# Patient Record
Sex: Female | Born: 2000 | Race: Black or African American | Hispanic: No | Marital: Single | Smoking: Never smoker
Health system: Southern US, Community
[De-identification: ages and names within clinical notes are randomized; demographics above are authoritative.]

## PROBLEM LIST (undated history)

## (undated) DIAGNOSIS — D563 Thalassemia minor: Secondary | ICD-10-CM

## (undated) HISTORY — DX: Thalassemia minor: D56.3

---
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
## 2019-02-21 ENCOUNTER — Telehealth: Payer: Self-pay | Admitting: *Deleted

## 2019-02-21 NOTE — Telephone Encounter (Signed)
Pharmacy made aware Lo Lo PA approved by insurance.

## 2019-07-25 ENCOUNTER — Telehealth: Payer: Self-pay | Admitting: Adult Health

## 2019-07-25 MED ORDER — LO LOESTRIN FE 1 MG-10 MCG / 10 MCG PO TABS: 1.0000 | ORAL_TABLET | Freq: Every day | ORAL | 4 refills | Status: DC

## 2019-07-25 NOTE — Addendum Note (Signed)
Addended by: Derrek Monaco A on: 07/25/2019 03:54 PM   Modules accepted: Orders

## 2019-07-25 NOTE — Telephone Encounter (Signed)
Pt is wanting to know if she needs an appt before her birth control can be refilled.

## 2019-07-25 NOTE — Telephone Encounter (Signed)
Refilled lo loestrin, for 1 year, she is happy it

## 2019-12-25 ENCOUNTER — Ambulatory Visit: Payer: Self-pay | Attending: Internal Medicine

## 2019-12-25 ENCOUNTER — Other Ambulatory Visit: Payer: Self-pay

## 2019-12-25 DIAGNOSIS — Z20822 Contact with and (suspected) exposure to covid-19: Secondary | ICD-10-CM | POA: Insufficient documentation

## 2019-12-26 LAB — NOVEL CORONAVIRUS, NAA: SARS-CoV-2, NAA: NOT DETECTED

## 2020-02-05 ENCOUNTER — Ambulatory Visit: Payer: 59 | Attending: Internal Medicine

## 2020-02-05 ENCOUNTER — Other Ambulatory Visit: Payer: Self-pay

## 2020-02-05 DIAGNOSIS — Z20822 Contact with and (suspected) exposure to covid-19: Secondary | ICD-10-CM | POA: Insufficient documentation

## 2020-02-07 LAB — NOVEL CORONAVIRUS, NAA: SARS-CoV-2, NAA: NOT DETECTED

## 2023-04-20 ENCOUNTER — Ambulatory Visit: Payer: 59 | Admitting: Nurse Practitioner

## 2023-04-20 ENCOUNTER — Encounter: Payer: Self-pay | Admitting: Nurse Practitioner

## 2023-04-20 VITALS — BP 114/66 | Ht 64.25 in | Wt 180.0 lb

## 2023-04-20 DIAGNOSIS — Z3041 Encounter for surveillance of contraceptive pills: Secondary | ICD-10-CM

## 2023-04-20 DIAGNOSIS — Z01419 Encounter for gynecological examination (general) (routine) without abnormal findings: Secondary | ICD-10-CM

## 2023-04-20 DIAGNOSIS — Z113 Encounter for screening for infections with a predominantly sexual mode of transmission: Secondary | ICD-10-CM | POA: Diagnosis not present

## 2023-04-20 DIAGNOSIS — Z8349 Family history of other endocrine, nutritional and metabolic diseases: Secondary | ICD-10-CM

## 2023-04-20 LAB — CBC WITH DIFFERENTIAL/PLATELET
Basophils Absolute: 62 cells/uL (ref 0–200)
Basophils Relative: 0.8 %
Eosinophils Absolute: 223 cells/uL (ref 15–500)
HCT: 38.2 % (ref 35.0–45.0)
Hemoglobin: 12 g/dL (ref 11.7–15.5)
MCHC: 31.4 g/dL — ABNORMAL LOW (ref 32.0–36.0)
MPV: 13.1 fL — ABNORMAL HIGH (ref 7.5–12.5)
Monocytes Relative: 5.7 %
Neutro Abs: 4274 cells/uL (ref 1500–7800)
Platelets: 81 10*3/uL — ABNORMAL LOW (ref 140–400)
RDW: 13.1 % (ref 11.0–15.0)
WBC: 7.7 10*3/uL (ref 3.8–10.8)

## 2023-04-20 MED ORDER — NORETHIN ACE-ETH ESTRAD-FE 1-20 MG-MCG PO TABS: 1.0000 | ORAL_TABLET | Freq: Every day | ORAL | 3 refills | Status: DC

## 2023-04-20 NOTE — Progress Notes (Signed)
   Christy Park 2001/05/28 [id number]   History:  22 y.o. G0 presents as new patient to establish care. Monthly cycles. COCs. Normal pap history. Gardasil series completed. Would like STD screening today. Multiple family family members on mother's side with thyroid disease.   Gynecologic History Patient's last menstrual period was 03/30/2023 (exact date). Period Cycle (Days): 28 Period Duration (Days): 5 Period Pattern: Regular Menstrual Flow: Heavy Menstrual Control: Maxi pad Dysmenorrhea: (!) Moderate Dysmenorrhea Symptoms: Cramping Contraception/Family planning: OCP (estrogen/progesterone) Sexually active: Yes  Health Maintenance Last Pap: 01/24/2021. Results were: Normal Last mammogram: Not indicated Last colonoscopy: Not indicated Last Dexa: Not indicated   Past medical history, past surgical history, family history and social history were all reviewed and documented in the EPIC chart. 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   ROS:  A ROS was performed and pertinent positives and negatives are included.  Exam:  Vitals:   04/20/23 0758  BP: 114/66  Weight: 180 lb (81.6 kg)  Height: 5' 4.25" (1.632 m)   Body mass index is 30.66 kg/m.  General appearance:  Normal Thyroid:  Symmetrical, normal in size, without palpable masses or nodularity. Respiratory  Auscultation:  Clear without wheezing or rhonchi Cardiovascular  Auscultation:  Regular rate, without rubs, murmurs or gallops  Edema/varicosities:  Not grossly evident Abdominal  Soft,nontender, without masses, guarding or rebound.  Liver/spleen:  No organomegaly noted  Hernia:  None appreciated  Skin  Inspection:  Grossly normal Breasts: Not indicated per guidelines Genitourinary   Inguinal/mons:  Normal without inguinal adenopathy  External genitalia:  Normal appearing vulva with no masses, tenderness, or lesions  BUS/Urethra/Skene's glands:  Normal  Vagina:  Normal appearing with normal  color and discharge, no lesions  Cervix:  Normal appearing without discharge or lesions  Uterus:  Normal in size, shape and contour.  Midline and mobile, nontender  Adnexa/parametria:     Rt: Normal in size, without masses or tenderness.   Lt: Normal in size, without masses or tenderness.  Anus and perineum: Normal  Digital rectal exam: Deferred  Patient informed chaperone available to be present for breast and pelvic exam. Patient has requested no chaperone to be present. Patient has been advised what will be completed during breast and pelvic exam.   Assessment/Plan:  22 y.o. G0 to establish care.   Well female exam with routine gynecological exam - Plan: CBC with Differential/Platelet, Comprehensive metabolic panel. Education provided on SBEs, importance of preventative screenings, current guidelines, high calcium diet, regular exercise, and multivitamin daily.   Screening examination for STD (sexually transmitted disease) - Plan: SURESWAB CT/NG/T. vaginalis, HIV Antibody (routine testing w rflx), RPR  Encounter for surveillance of contraceptive pills - Plan: norethindrone-ethinyl estradiol-FE (LOESTRIN FE) 1-20 MG-MCG tablet daily. Taking as prescribed. Refill x 1 year provided.   Family history of thyroid disease - Plan: TSH  Screening for cervical cancer - Normal Pap history.  Will repeat at 3-year interval per guidelines.   Return in 1 year for annual.     Olivia Mackie DNP, 8:17 AM 04/20/2023

## 2023-04-21 ENCOUNTER — Other Ambulatory Visit: Payer: Self-pay | Admitting: Nurse Practitioner

## 2023-04-21 DIAGNOSIS — D696 Thrombocytopenia, unspecified: Secondary | ICD-10-CM

## 2023-04-21 LAB — COMPREHENSIVE METABOLIC PANEL
AG Ratio: 1.3 (calc) (ref 1.0–2.5)
ALT: 9 U/L (ref 6–29)
AST: 13 U/L (ref 10–30)
Albumin: 3.9 g/dL (ref 3.6–5.1)
Alkaline phosphatase (APISO): 68 U/L (ref 31–125)
BUN/Creatinine Ratio: 9 (calc) (ref 6–22)
BUN: 6 mg/dL — ABNORMAL LOW (ref 7–25)
CO2: 23 mmol/L (ref 20–32)
Calcium: 9.2 mg/dL (ref 8.6–10.2)
Chloride: 105 mmol/L (ref 98–110)
Creat: 0.69 mg/dL (ref 0.50–0.96)
Globulin: 3 g/dL (calc) (ref 1.9–3.7)
Glucose, Bld: 91 mg/dL (ref 65–99)
Potassium: 4.2 mmol/L (ref 3.5–5.3)
Sodium: 136 mmol/L (ref 135–146)
Total Bilirubin: 0.3 mg/dL (ref 0.2–1.2)
Total Protein: 6.9 g/dL (ref 6.1–8.1)

## 2023-04-21 LAB — CBC WITH DIFFERENTIAL/PLATELET
Absolute Monocytes: 439 cells/uL (ref 200–950)
Eosinophils Relative: 2.9 %
Lymphs Abs: 2703 cells/uL (ref 850–3900)
MCH: 22.1 pg — ABNORMAL LOW (ref 27.0–33.0)
MCV: 70.5 fL — ABNORMAL LOW (ref 80.0–100.0)
Neutrophils Relative %: 55.5 %
RBC: 5.42 10*6/uL — ABNORMAL HIGH (ref 3.80–5.10)
Total Lymphocyte: 35.1 %

## 2023-04-21 LAB — RPR: RPR Ser Ql: NONREACTIVE

## 2023-04-21 LAB — SURESWAB CT/NG/T. VAGINALIS
C. trachomatis RNA, TMA: NOT DETECTED
N. gonorrhoeae RNA, TMA: NOT DETECTED
Trichomonas vaginalis RNA: NOT DETECTED

## 2023-04-21 LAB — TSH: TSH: 2.4 mIU/L

## 2023-04-21 LAB — HIV ANTIBODY (ROUTINE TESTING W REFLEX): HIV 1&2 Ab, 4th Generation: NONREACTIVE

## 2023-04-23 ENCOUNTER — Encounter: Payer: Self-pay | Admitting: Physician Assistant

## 2023-04-23 ENCOUNTER — Inpatient Hospital Stay: Payer: 59 | Admitting: Physician Assistant

## 2023-04-23 ENCOUNTER — Inpatient Hospital Stay: Payer: 59 | Attending: Physician Assistant

## 2023-04-23 VITALS — BP 121/78 | HR 67 | Temp 97.9°F | Resp 15 | Ht 64.25 in | Wt 181.1 lb

## 2023-04-23 DIAGNOSIS — D696 Thrombocytopenia, unspecified: Secondary | ICD-10-CM | POA: Insufficient documentation

## 2023-04-23 DIAGNOSIS — R718 Other abnormality of red blood cells: Secondary | ICD-10-CM | POA: Diagnosis not present

## 2023-04-23 LAB — CMP (CANCER CENTER ONLY)
ALT: 10 U/L (ref 0–44)
AST: 15 U/L (ref 15–41)
Albumin: 4.5 g/dL (ref 3.5–5.0)
Alkaline Phosphatase: 73 U/L (ref 38–126)
Anion gap: 7 (ref 5–15)
BUN: 8 mg/dL (ref 6–20)
CO2: 27 mmol/L (ref 22–32)
Calcium: 9.8 mg/dL (ref 8.9–10.3)
Chloride: 104 mmol/L (ref 98–111)
Creatinine: 0.71 mg/dL (ref 0.44–1.00)
GFR, Estimated: >60 mL/min (ref >60)
Glucose, Bld: 89 mg/dL (ref 70–99)
Potassium: 3.9 mmol/L (ref 3.5–5.1)
Sodium: 138 mmol/L (ref 135–145)
Total Bilirubin: 0.4 mg/dL (ref 0.3–1.2)
Total Protein: 8.2 g/dL — ABNORMAL HIGH (ref 6.5–8.1)

## 2023-04-23 LAB — CBC WITH DIFFERENTIAL (CANCER CENTER ONLY)
Abs Immature Granulocytes: 0.04 10*3/uL (ref 0.00–0.07)
Basophils Absolute: 0.1 10*3/uL (ref 0.0–0.1)
Basophils Relative: 1 %
Eosinophils Absolute: 0.2 10*3/uL (ref 0.0–0.5)
Eosinophils Relative: 2 %
HCT: 41.8 % (ref 36.0–46.0)
Hemoglobin: 13.3 g/dL (ref 12.0–15.0)
Immature Granulocytes: 0 %
Lymphocytes Relative: 22 %
Lymphs Abs: 2 10*3/uL (ref 0.7–4.0)
MCH: 22.4 pg — ABNORMAL LOW (ref 26.0–34.0)
MCHC: 31.8 g/dL (ref 30.0–36.0)
MCV: 70.3 fL — ABNORMAL LOW (ref 80.0–100.0)
Monocytes Absolute: 0.4 10*3/uL (ref 0.1–1.0)
Monocytes Relative: 4 %
Neutro Abs: 6.4 10*3/uL (ref 1.7–7.7)
Neutrophils Relative %: 71 %
Platelet Count: ADEQUATE 10*3/uL (ref 150–400)
RBC: 5.95 MIL/uL — ABNORMAL HIGH (ref 3.87–5.11)
RDW: 13.9 % (ref 11.5–15.5)
Smear Review: ADEQUATE
WBC Count: 9 10*3/uL (ref 4.0–10.5)
nRBC: 0 % (ref 0.0–0.2)

## 2023-04-23 LAB — TECHNOLOGIST SMEAR REVIEW: Plt Morphology: ADEQUATE

## 2023-04-23 LAB — HEPATITIS B SURFACE ANTIBODY,QUALITATIVE: Hep B S Ab: NONREACTIVE

## 2023-04-23 LAB — IRON AND IRON BINDING CAPACITY (CC-WL,HP ONLY)
Iron: 77 ug/dL (ref 28–170)
Saturation Ratios: 14 % (ref 10.4–31.8)
TIBC: 545 ug/dL — ABNORMAL HIGH (ref 250–450)
UIBC: 468 ug/dL — ABNORMAL HIGH (ref 148–442)

## 2023-04-23 LAB — IMMATURE PLATELET FRACTION: Immature Platelet Fraction: 15.6 % — ABNORMAL HIGH (ref 1.2–8.6)

## 2023-04-23 LAB — HEPATITIS B SURFACE ANTIGEN: Hepatitis B Surface Ag: NONREACTIVE

## 2023-04-23 LAB — HEPATITIS C ANTIBODY: HCV Ab: NONREACTIVE

## 2023-04-23 LAB — HEPATITIS B CORE ANTIBODY, TOTAL: Hep B Core Total Ab: NONREACTIVE

## 2023-04-23 LAB — FOLATE: Folate: 13.6 ng/mL (ref >5.9)

## 2023-04-23 LAB — FERRITIN: Ferritin: 16 ng/mL (ref 11–307)

## 2023-04-23 LAB — VITAMIN B12: Vitamin B-12: 162 pg/mL — ABNORMAL LOW (ref 180–914)

## 2023-04-23 NOTE — Progress Notes (Unsigned)
[facility] Telephone:([phone]) [phone]   Fax:(336) [phone]  INITIAL CONSULT NOTE  Patient Care Team: Patient, No Pcp Per as PCP - General (General Practice)  Hematological/Oncological History 04/20/2023: WBC 7.7, Hgb 12.0, MCV 70.5 (L), Plt 81 (L). HIV nonreactive. Creatinine and LFTs normal.  04/23/2023: Establish care with [facility] Hematology  CHIEF COMPLAINTS/PURPOSE OF CONSULTATION:  "Microcytosis and Thrombocytopenia "  HISTORY OF PRESENTING ILLNESS:  Christy Park 22 y.o. female without any medical conditions presents to the hematology clinic for evaluation of microcytosis and thrombocytopenia. She is unaccompanied for this visit.  On exam today, Christy Park reports she is overall feeling well without any concerning symptoms. Her energy is adequate and she is able to complete all her ADLs on her own. She denies any dietary restrictions and tries to eat regularly to avoid her blood sugar from dropping too low. She denies any nausea, vomiting or abdominal pain. Her bowel habits are unchanged without recurrent episodes of diarrhea or constipation. She denies easy bruising or signs of bleeding. She has regular menstrual cycles occurring every 28 days. Each cycle lasts 5-7 days with 3 days of heavy bleeding. She denies fevers, chills, sweats, shortness of breath, chest pain or cough. She has no other complaints. Rest of the 10 point ROS is below.   MEDICAL HISTORY:  History reviewed. No pertinent past medical history.  SURGICAL HISTORY: History reviewed. No pertinent surgical history.  SOCIAL HISTORY: Social History   Socioeconomic History   Marital status: Single    Spouse name: Not on file   Number of children: Not on file   Years of education: Not on file   Highest education level: Not on file  Occupational History   Not on file  Tobacco Use   Smoking status: Never    Passive exposure: Never   Smokeless tobacco: Never  Substance and Sexual Activity   Alcohol  use: Never   Drug use: Never   Sexual activity: Yes    Partners: Male    Birth control/protection: Pill    Comment: menarche 22yo, sexual debut 22yo  Other Topics Concern   Not on file  Social History Narrative   Not on file   Social Determinants of Health   Financial Resource Strain: Not on file  Food Insecurity: No Food Insecurity (04/23/2023)   Hunger Vital Sign    Worried About Running Out of Food in the Last Year: Never true    Ran Out of Food in the Last Year: Never true  Transportation Needs: No Transportation Needs (04/23/2023)   PRAPARE - Administrator, Civil Service (Medical): No    Lack of Transportation (Non-Medical): No  Physical Activity: Not on file  Stress: Not on file  Social Connections: Not on file  Intimate Partner Violence: Not on file    FAMILY HISTORY: Family History  Problem Relation Age of Onset   Diabetes Father    Prostate cancer Father    Other Maternal Grandmother        tumor    ALLERGIES:  has No Known Allergies.  MEDICATIONS:  Current Outpatient Medications  Medication Sig Dispense Refill   norethindrone-ethinyl estradiol-FE (LOESTRIN FE) 1-20 MG-MCG tablet Take 1 tablet by mouth daily. 84 tablet 3   No current facility-administered medications for this visit.    REVIEW OF SYSTEMS:   Constitutional: ( - ) fevers, ( - )  chills , ( - ) night sweats Eyes: ( - ) blurriness of vision, ( - ) double  vision, ( - ) watery eyes Ears, nose, mouth, throat, and face: ( - ) mucositis, ( - ) sore throat Respiratory: ( - ) cough, ( - ) dyspnea, ( - ) wheezes Cardiovascular: ( - ) palpitation, ( - ) chest discomfort, ( - ) lower extremity swelling Gastrointestinal:  ( - ) nausea, ( - ) heartburn, ( - ) change in bowel habits Skin: ( - ) abnormal skin rashes Lymphatics: ( - ) new lymphadenopathy, ( - ) easy bruising Neurological: ( - ) numbness, ( - ) tingling, ( - ) new weaknesses Behavioral/Psych: ( - ) mood change, ( - ) new changes   All other systems were reviewed with the patient and are negative.  PHYSICAL EXAMINATION: ECOG PERFORMANCE STATUS: 0 - Asymptomatic  Vitals:   04/23/23 1102  BP: 121/78  Pulse: 67  Resp: 15  Temp: 97.9 F (36.6 C)  SpO2: 100%   Filed Weights   04/23/23 1102  Weight: 181 lb 1.6 oz (82.1 kg)    GENERAL: well appearing female in NAD  SKIN: skin color, texture, turgor are normal, no rashes or significant lesions EYES: conjunctiva are pink and non-injected, sclera clear LYMPH:  no palpable lymphadenopathy in the cervical or supraclavicular lymph nodes.  LUNGS: clear to auscultation and percussion with normal breathing effort HEART: regular rate & rhythm and no murmurs and no lower extremity edema ABDOMEN: soft, non-tender, non-distended, normal bowel sounds Musculoskeletal: no cyanosis of digits and no clubbing  PSYCH: alert & oriented x 3, fluent speech NEURO: no focal motor/sensory deficits  LABORATORY DATA:  I have reviewed the data as listed    Latest Ref Rng & Units 04/20/2023    8:14 AM 03/25/2018    9:19 AM  CBC  WBC 3.8 - 10.8 Thousand/uL 7.7  8.2   Hemoglobin 11.7 - 15.5 g/dL 16.1  09.6   Hematocrit 35.0 - 45.0 % 38.2  36.4   Platelets 140 - 400 Thousand/uL 81  235        Latest Ref Rng & Units 04/20/2023    8:14 AM 03/25/2018    9:19 AM  CMP  Glucose 65 - 99 mg/dL 91  88   BUN 7 - 25 mg/dL 6  9   Creatinine 0.45 - 0.96 mg/dL 4.09  8.11   Sodium 914 - 146 mmol/L 136  136   Potassium 3.5 - 5.3 mmol/L 4.2  4.4   Chloride 98 - 110 mmol/L 105  102   CO2 20 - 32 mmol/L 23  19   Calcium 8.6 - 10.2 mg/dL 9.2  9.7   Total Protein 6.1 - 8.1 g/dL 6.9  7.2   Total Bilirubin 0.2 - 1.2 mg/dL 0.3  0.4   Alkaline Phos 43 - 101 IU/L  73   AST 10 - 30 U/L 13  20   ALT 6 - 29 U/L 9  13     ASSESSMENT & PLAN Christy Park is a 22 y.o. female who presents to the hematology clinic for evaluation of microcytosis and thrombocytopenia.   I reviewed potential etiologies  for thrombocytopenia including liver disease and/or splenomegaly, infectious processes, nutritional anemias, immune mediated, pseudothrombocytopenia and bone marrow disorders. Patient is taking an OTC sea moss supplemental for the last two weeks. Advised to hold supplement until our workup is complete. Patient expressed understanding of the plan provided.   Regarding her microcytosis, etiologies including iron deficiency and hemoglobinopathies. We will check iron panel, hgb electrophoresis and alpha thalassemia gene testing.    #  Thrombocytopenia, etiology unknown: --Labs from 04/20/2023 showed Hgb 12.0, MCV 70.5 (L), Plt 81 (L). HIV nonreactive. --Labs today to check CBC w/diff, CMP, B12 level, folate level, Hep B and C serologies --Evaluate for platelet abnormality with peripheral smear. --Evaluate for liver disease and/or splenomegaly with abdominal US if today's blood is unremarkable.  --RTC based on above workup.   #Microcytosis: --Labs today to check ferritin, iron and TIBC, hgb electrophoresis, alpha thalassemia gene testing  Orders Placed This Encounter  Procedures   CBC with Differential (Cancer Center Only)    Standing Status:   Future    Number of Occurrences:   1    Standing Expiration Date:   04/22/2024   CMP (Cancer Center only)    Standing Status:   Future    Number of Occurrences:   1    Standing Expiration Date:   04/22/2024   Immature Platelet Fraction    Standing Status:   Future    Number of Occurrences:   1    Standing Expiration Date:   04/22/2024   Technologist smear review    Order Specific Question:   Clinical information:    Answer:   thrombocytopenia, microcytosis   Vitamin B12    Standing Status:   Future    Number of Occurrences:   1    Standing Expiration Date:   04/22/2024   Methylmalonic acid, serum    Standing Status:   Future    Number of Occurrences:   1    Standing Expiration Date:   04/22/2024   Folate, Serum    Standing Status:   Future    Number of  Occurrences:   1    Standing Expiration Date:   04/22/2024   Ferritin    Standing Status:   Future    Number of Occurrences:   1    Standing Expiration Date:   04/22/2024   Hepatitis B core antibody, total    Standing Status:   Future    Number of Occurrences:   1    Standing Expiration Date:   04/22/2024   Hepatitis B surface antibody    Standing Status:   Future    Number of Occurrences:   1    Standing Expiration Date:   04/22/2024   Hepatitis B surface antigen    Standing Status:   Future    Number of Occurrences:   1    Standing Expiration Date:   04/22/2024   Hepatitis C antibody    Standing Status:   Future    Number of Occurrences:   1    Standing Expiration Date:   04/22/2024   Alpha-Thalassemia GenotypR    Standing Status:   Future    Number of Occurrences:   1    Standing Expiration Date:   04/22/2024   Hgb Fractionation Cascade    Standing Status:   Future    Number of Occurrences:   1    Standing Expiration Date:   04/22/2024    All questions were answered. The patient knows to call the clinic with any problems, questions or concerns.  I have spent a total of 60 minutes minutes of face-to-face and non-face-to-face time, preparing to see the patient, obtaining and/or reviewing separately obtained history, performing a medically appropriate examination, counseling and educating the patient, ordering tests/procedures,  documenting clinical information in the electronic health record, and care coordination.   Georga Kaufmann, PA-C Department of Hematology/Oncology [facility]  Phone: [phone]  Patient was seen with Dr. Candise Che.    ADDENDUM  .Patient was Personally and independently interviewed, examined and relevant elements of the history of present illness were reviewed in details and an assessment and plan was created. All elements of the patient's history of present illness , assessment and plan were discussed in details with Georga Kaufmann,  PA-C. The above documentation reflects our combined findings assessment and plan.   Wyvonnia Lora MD MS

## 2023-04-27 LAB — METHYLMALONIC ACID, SERUM: Methylmalonic Acid, Quantitative: 97 nmol/L (ref 0–378)

## 2023-04-28 LAB — HGB FRACTIONATION CASCADE
Hgb A2: 2.7 % (ref 1.8–3.2)
Hgb A: 97.3 % (ref 96.4–98.8)
Hgb F: 0 % (ref 0.0–2.0)
Hgb S: 0 %

## 2023-05-07 ENCOUNTER — Other Ambulatory Visit: Payer: Self-pay | Admitting: Physician Assistant

## 2023-05-07 NOTE — Progress Notes (Signed)
Tried to reach patient and mother. Unable to leave voicemail for patient. Left voicemail for patient's mother to return call to review lab results.

## 2023-05-10 ENCOUNTER — Telehealth: Payer: Self-pay | Admitting: Physician Assistant

## 2023-05-10 MED ORDER — VITAMIN B-12 1000 MCG PO TABS: 1000.0000 ug | ORAL_TABLET | Freq: Every day | ORAL | 3 refills | Status: AC

## 2023-05-10 NOTE — Telephone Encounter (Signed)
I spoke to [name] to review the labs from 04/23/2023. CBC showed that the platelets clumped but the count was adequate. She still has persistent microcytosis with MCV 70.3. No evidence of leukopenia or anemia. Remaining labs shows iron deficiency and vitamin B12 deficiency. Patient picked up a prescription for iron supplementation. Advised to aim for 65 mg of elemental iron per day and take with a source of vitamin C.Likely cause of iron deficiency is heavy menstrual cycles so recommend to follow up with OB/GYN.   Lastly, there is evidence of vitamin B12 deficiency so sent a prescription for vitamin B12 1000 mcg /day.   We will see patient back in clinic in 3 months with repeat labs. Only remaining lab that is pending is alpha thalassemia gene testing, I will follow up with patient once that is available.   Patient expressed understanding of the plan provided.

## 2023-05-19 LAB — ALPHA-THALASSEMIA GENOTYPR

## 2023-08-04 ENCOUNTER — Telehealth: Payer: Self-pay | Admitting: Physician Assistant

## 2023-08-04 NOTE — Telephone Encounter (Signed)
Cancelled appointments per incoming call and patient request. At this time, the patient does not want to reschedule. Patient states she will call back when she is ready to reschedule.

## 2023-08-06 ENCOUNTER — Inpatient Hospital Stay: Payer: 59 | Admitting: Physician Assistant

## 2023-08-06 ENCOUNTER — Inpatient Hospital Stay: Payer: 59

## 2023-12-30 ENCOUNTER — Other Ambulatory Visit: Payer: Self-pay

## 2023-12-30 ENCOUNTER — Encounter (HOSPITAL_COMMUNITY): Payer: Self-pay | Admitting: Emergency Medicine

## 2023-12-30 ENCOUNTER — Emergency Department (HOSPITAL_COMMUNITY)
Admission: EM | Admit: 2023-12-30 | Discharge: 2023-12-31 | Disposition: A | Discharge status: Home / Self Care | Payer: 59 | Attending: Emergency Medicine | Admitting: Emergency Medicine

## 2023-12-30 DIAGNOSIS — J0300 Acute streptococcal tonsillitis, unspecified: Secondary | ICD-10-CM | POA: Insufficient documentation

## 2023-12-30 DIAGNOSIS — Z20822 Contact with and (suspected) exposure to covid-19: Secondary | ICD-10-CM | POA: Insufficient documentation

## 2023-12-30 DIAGNOSIS — R509 Fever, unspecified: Secondary | ICD-10-CM

## 2023-12-30 DIAGNOSIS — R052 Subacute cough: Secondary | ICD-10-CM

## 2023-12-30 MED ORDER — ACETAMINOPHEN 325 MG PO TABS
650.0000 mg | ORAL_TABLET | Freq: Once | ORAL | Status: AC | PRN
  Administered 2023-12-30: 650 mg via ORAL
  Filled 2023-12-30: qty 2

## 2023-12-30 NOTE — ED Triage Notes (Signed)
 Pt in with cough and weakness progressively worse x 1 mo. Pt states she went to UC last week and was placed on prednisone and feels worse. Also reports fever that developed today

## 2023-12-31 LAB — RESP PANEL BY RT-PCR (RSV, FLU A&B, COVID)  RVPGX2
Influenza A by PCR: NEGATIVE
Influenza B by PCR: NEGATIVE
Resp Syncytial Virus by PCR: NEGATIVE
SARS Coronavirus 2 by RT PCR: NEGATIVE

## 2023-12-31 LAB — GROUP A STREP BY PCR: Group A Strep by PCR: DETECTED — AB

## 2023-12-31 MED ORDER — PENICILLIN G BENZATHINE 1200000 UNIT/2ML IM SUSY
1.2000 10*6.[IU] | PREFILLED_SYRINGE | Freq: Once | INTRAMUSCULAR | Status: AC
  Administered 2023-12-31: 1.2 10*6.[IU] via INTRAMUSCULAR
  Filled 2023-12-31: qty 2

## 2023-12-31 MED ORDER — DEXAMETHASONE 4 MG PO TABS
10.0000 mg | ORAL_TABLET | Freq: Once | ORAL | Status: AC
  Administered 2023-12-31: 10 mg via ORAL
  Filled 2023-12-31: qty 3

## 2023-12-31 NOTE — ED Provider Notes (Signed)
 [place] EMERGENCY DEPARTMENT AT [facility] Provider Note   CSN: [id number] Arrival date & time: 12/30/23  2247     History  Chief Complaint  Patient presents with   Fever   Cough    Christy Park is a 24 y.o. female.  The history is provided by the patient.  Fever Associated symptoms: cough   Cough Associated symptoms: fever   She complains of sore throat for the last 3 days, fever today as high as 101.  There have been associated chills and sweats.  She has also had a cough for about the last month which is productive of some yellowish sputum.  She had nausea and vomiting about a week ago, none since then.  She started having arthralgias and myalgias today.  She denies any sick contacts.   Home Medications Prior to Admission medications   Medication Sig Start Date End Date Taking? Authorizing Provider  cyanocobalamin  (VITAMIN B12) 1000 MCG tablet Take 1 tablet (1,000 mcg total) by mouth daily. 05/10/23   Thayil, Irene T, PA-C  norethindrone-ethinyl estradiol-FE (LOESTRIN  FE) 1-20 MG-MCG tablet Take 1 tablet by mouth daily. 04/20/23   Prentiss Annabella LABOR, NP      Allergies    Patient has no known allergies.    Review of Systems   Review of Systems  Constitutional:  Positive for fever.  Respiratory:  Positive for cough.   All other systems reviewed and are negative.   Physical Exam Updated Vital Signs BP (!) 151/84   Pulse (!) 118   Temp (!) 100.8 F (38.2 C) (Oral)   Resp 20   Wt 83.9 kg   LMP 12/01/2023   SpO2 100%   BMI 31.51 kg/m  Physical Exam Vitals and nursing note reviewed.   23 year old female, resting comfortably and in no acute distress. Vital signs are significant for elevated temperature, heart rate, blood pressure. Oxygen saturation is 100%, which is normal. Head is normocephalic and atraumatic. PERRLA, EOMI. Oropharynx shows tonsillar erythema and hypertrophy with exudate present on the right.  Voice is minimally muffled.  There is  no pooling of secretions. Neck is nontender and supple without adenopathy. Lungs are clear without rales, wheezes, or rhonchi. Chest is nontender. Heart has regular rate and rhythm without murmur. Abdomen is soft, flat, nontender. Skin is warm and dry without rash. Neurologic: Mental status is normal, cranial nerves are intact, moves all extremities equally.  ED Results / Procedures / Treatments   Labs (all labs ordered are listed, but only abnormal results are displayed) Labs Reviewed  GROUP A STREP BY PCR - Abnormal; Notable for the following components:      Result Value   Group A Strep by PCR DETECTED (*)    All other components within normal limits  RESP PANEL BY RT-PCR (RSV, FLU A&B, COVID)  [id number]   Procedures Procedures    Medications Ordered in ED Medications  penicillin  g benzathine (BICILLIN  LA) 1200000 UNIT/2ML injection 1.2 Million Units (has no administration in time range)  dexamethasone  (DECADRON ) tablet 10 mg (has no administration in time range)  acetaminophen  (TYLENOL ) tablet 650 mg (650 mg Oral Given 12/30/23 2321)    ED Course/ Medical Decision Making/ A&P                                 Medical Decision Making Risk OTC drugs. Prescription drug management.   Cough, tonsillitis.  I  suspect that these are actually 2 separate processes.  Likely viral illness, consider streptococcal infection.  I have reviewed her laboratory test, and my interpretation is negative PCR for COVID-19, Influenza, RSV.  Positive PCR for strep.  Strep is clearly what is causing her throat symptoms and tonsillitis, but I doubt it is responsible for her cough.  Through shared decision making, I offered patient the option of 10 days of oral antibiotics versus parenteral penicillin .  She has opted for parenteral penicillin .  I have ordered a dose of Bicillin  LA and oral dexamethasone .  Final Clinical Impression(s) / ED Diagnoses Final diagnoses:  Streptococcal tonsillitis  Fever in  adult  Subacute cough    Rx / DC Orders ED Discharge Orders     None         Raford Lenis, MD 12/31/23 [phone]

## 2023-12-31 NOTE — Discharge Instructions (Addendum)
Drink plenty of fluids.  Take acetaminophen and/or ibuprofen as needed for fever or aching.  Return if you have any new or concerning symptoms. 

## 2023-12-31 NOTE — ED Notes (Signed)
Patient verbalizes understanding of discharge instructions. Opportunity for questioning and answers were provided. Armband removed by staff, pt discharged from ED. Ambulated out to lobby with dad

## 2024-03-07 ENCOUNTER — Encounter: Payer: Self-pay | Admitting: Family Medicine

## 2024-03-07 ENCOUNTER — Ambulatory Visit: Admitting: Family Medicine

## 2024-03-07 VITALS — BP 120/82 | HR 105 | Temp 98.5°F | Ht 64.25 in | Wt 186.8 lb

## 2024-03-07 DIAGNOSIS — D563 Thalassemia minor: Secondary | ICD-10-CM

## 2024-03-07 DIAGNOSIS — R718 Other abnormality of red blood cells: Secondary | ICD-10-CM

## 2024-03-07 NOTE — Progress Notes (Signed)
 Subjective:    Patient ID: Christy Park, female    DOB: [birth year], 23 y.o.   MRN: [id number]  HPI Patient is a very sweet 23 year old African-American female who presents today to establish care.  She is the daughter of Casimiro Needle and Elease Hashimoto who are also my patients.  She denies any concerns.  However last year she was dealing with fatigue.  Lab work at that time revealed microcytosis.  She also had an apparent thrombocytopenia.  She was referred to hematology.  The thrombocytopenia was found to be falls.  Instead this was due to platelet aggregation and clumping on the smear.  However she had persistent microcytosis and was found to have alpha thalassemia trait.  She was also found to have iron deficiency and B12 deficiency.  She is currently on iron and B12 but she would like to repeat these lab values today.  She states that she is feeling better since she started this.  She also takes a women's One-A-Day vitamin.  She is on birth control for heavy periods.  She has an appointment to see her gynecologist later this year and she is due for a Pap smear at that appointment.  She appears to be due for a tetanus shot.  She defers that today. Past Medical History:  Diagnosis Date   Thalassemia alpha carrier    No past surgical history on file. Current Outpatient Medications on File Prior to Visit  Medication Sig Dispense Refill   cyanocobalamin (VITAMIN B12) 1000 MCG tablet Take 1 tablet (1,000 mcg total) by mouth daily. 30 tablet 3   ferrous sulfate 324 MG TBEC Take 324 mg by mouth daily with breakfast.     norethindrone-ethinyl estradiol-FE (LOESTRIN FE) 1-20 MG-MCG tablet Take 1 tablet by mouth daily. 84 tablet 3   No current facility-administered medications on file prior to visit.   No Known Allergies Social History   Socioeconomic History   Marital status: Single    Spouse name: Not on file   Number of children: Not on file   Years of education: Not on file   Highest education  level: Bachelor's degree (e.g., BA, AB, BS)  Occupational History   Not on file  Tobacco Use   Smoking status: Never    Passive exposure: Never   Smokeless tobacco: Never  Substance and Sexual Activity   Alcohol use: Never   Drug use: Never   Sexual activity: Yes    Partners: Male    Birth control/protection: Pill    Comment: menarche 23yo, sexual debut 23yo  Other Topics Concern   Not on file  Social History Narrative   Not on file   Social Drivers of Health   Financial Resource Strain: Low Risk  (03/03/2024)   Overall Financial Resource Strain (CARDIA)    Difficulty of Paying Living Expenses: Not hard at all  Food Insecurity: No Food Insecurity (03/03/2024)   Hunger Vital Sign    Worried About Running Out of Food in the Last Year: Never true    Ran Out of Food in the Last Year: Never true  Transportation Needs: No Transportation Needs (03/03/2024)   PRAPARE - Administrator, Civil Service (Medical): No    Lack of Transportation (Non-Medical): No  Physical Activity: Insufficiently Active (03/03/2024)   Exercise Vital Sign    Days of Exercise per Week: 2 days    Minutes of Exercise per Session: 60 min  Stress: No Stress Concern Present (03/03/2024)   [employer] of  Occupational Health - Occupational Stress Questionnaire    Feeling of Stress : Only a little  Social Connections: Moderately Isolated (03/03/2024)   Social Connection and Isolation Panel [NHANES]    Frequency of Communication with Friends and Family: More than three times a week    Frequency of Social Gatherings with Friends and Family: Patient declined    Attends Religious Services: More than 4 times per year    Active Member of [employer] or Organizations: No    Attends Engineer, structural: Not on file    Marital Status: Never married  Intimate Partner Violence: Not on file  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 Family History  Problem Relation Age  of Onset   Diabetes Father    Prostate cancer Father    Other Maternal Grandmother        tumor      Review of Systems  All other systems reviewed and are negative.      Objective:   Physical Exam Vitals reviewed.  Constitutional:      General: She is not in acute distress.    Appearance: Normal appearance. She is normal weight. She is not ill-appearing, toxic-appearing or diaphoretic.  HENT:     Right Ear: Tympanic membrane and ear canal normal.     Left Ear: Tympanic membrane and ear canal normal.     Nose: Nose normal. No congestion or rhinorrhea.     Mouth/Throat:     Mouth: Mucous membranes are moist.     Pharynx: Oropharynx is clear. No oropharyngeal exudate or posterior oropharyngeal erythema.  Eyes:     Extraocular Movements: Extraocular movements intact.     Conjunctiva/sclera: Conjunctivae normal.     Pupils: Pupils are equal, round, and reactive to light.  Neck:     Vascular: No carotid bruit.  Cardiovascular:     Rate and Rhythm: Regular rhythm. Tachycardia present.     Pulses: Normal pulses.     Heart sounds: Normal heart sounds.  Pulmonary:     Effort: Pulmonary effort is normal. No respiratory distress.     Breath sounds: Normal breath sounds. No wheezing, rhonchi or rales.  Abdominal:     General: Abdomen is flat. Bowel sounds are normal. There is no distension.     Palpations: Abdomen is soft.     Tenderness: There is no abdominal tenderness. There is no guarding or rebound.  Musculoskeletal:     Cervical back: Normal range of motion. No rigidity or tenderness.     Right lower leg: No edema.     Left lower leg: No edema.  Lymphadenopathy:     Cervical: No cervical adenopathy.  Neurological:     General: No focal deficit present.     Mental Status: She is alert and oriented to person, place, and time.  Psychiatric:        Mood and Affect: Mood normal.        Behavior: Behavior normal.        Thought Content: Thought content normal.         Judgment: Judgment normal.           Assessment & Plan:  Microcytosis - Plan: CBC with Differential/Platelet, COMPLETE METABOLIC PANEL WITH GFR, Vitamin B12, Iron  Thalassemia alpha carrier Given her history of B12 and iron deficiency I will repeat lab work today to monitor her B12 levels and iron levels.  Also monitor a CBC to follow her microcytosis  and check a CMP to monitor her kidneys her liver and her blood sugar.  The remainder of her physical exam is normal.  I will defer her Pap smear to her gynecologist.  Recommended a tetanus shot which the patient politely deferred.

## 2024-03-08 LAB — COMPLETE METABOLIC PANEL WITH GFR
AG Ratio: 1.5 (calc) (ref 1.0–2.5)
ALT: 13 U/L (ref 6–29)
AST: 17 U/L (ref 10–30)
Albumin: 4.4 g/dL (ref 3.6–5.1)
Alkaline phosphatase (APISO): 78 U/L (ref 31–125)
BUN: 7 mg/dL (ref 7–25)
CO2: 25 mmol/L (ref 20–32)
Calcium: 9.7 mg/dL (ref 8.6–10.2)
Chloride: 103 mmol/L (ref 98–110)
Creat: 0.67 mg/dL (ref 0.50–0.96)
Globulin: 3 g/dL (ref 1.9–3.7)
Glucose, Bld: 91 mg/dL (ref 65–99)
Potassium: 4.9 mmol/L (ref 3.5–5.3)
Sodium: 138 mmol/L (ref 135–146)
Total Bilirubin: 0.8 mg/dL (ref 0.2–1.2)
Total Protein: 7.4 g/dL (ref 6.1–8.1)

## 2024-03-08 LAB — CBC WITH DIFFERENTIAL/PLATELET
Absolute Lymphocytes: 2495 {cells}/uL (ref 850–3900)
Absolute Monocytes: 613 {cells}/uL (ref 200–950)
Basophils Absolute: 109 {cells}/uL (ref 0–200)
Basophils Relative: 1.3 %
Eosinophils Absolute: 176 {cells}/uL (ref 15–500)
Eosinophils Relative: 2.1 %
HCT: 40.7 % (ref 35.0–45.0)
Hemoglobin: 12.8 g/dL (ref 11.7–15.5)
MCH: 22.5 pg — ABNORMAL LOW (ref 27.0–33.0)
MCHC: 31.4 g/dL — ABNORMAL LOW (ref 32.0–36.0)
MCV: 71.5 fL — ABNORMAL LOW (ref 80.0–100.0)
MPV: 11.7 fL (ref 7.5–12.5)
Monocytes Relative: 7.3 %
Neutro Abs: 5006 {cells}/uL (ref 1500–7800)
Neutrophils Relative %: 59.6 %
Platelets: 101 10*3/uL — ABNORMAL LOW (ref 140–400)
RBC: 5.69 10*6/uL — ABNORMAL HIGH (ref 3.80–5.10)
RDW: 13.5 % (ref 11.0–15.0)
Total Lymphocyte: 29.7 %
WBC: 8.4 10*3/uL (ref 3.8–10.8)

## 2024-03-08 LAB — IRON: Iron: 174 ug/dL (ref 40–190)

## 2024-03-08 LAB — VITAMIN B12: Vitamin B-12: 494 pg/mL (ref 200–1100)

## 2024-03-24 ENCOUNTER — Other Ambulatory Visit: Payer: Self-pay

## 2024-03-24 DIAGNOSIS — Z3041 Encounter for surveillance of contraceptive pills: Secondary | ICD-10-CM

## 2024-03-24 MED ORDER — NORETHIN ACE-ETH ESTRAD-FE 1-20 MG-MCG PO TABS: 1.0000 | ORAL_TABLET | Freq: Every day | ORAL | 3 refills | Status: DC

## 2024-03-24 NOTE — Telephone Encounter (Signed)
 Med refill request: [place] fe  Last AEX: 04/20/23 Next AEX:05/08/24 Last MMG (if hormonal med) n/a Refill authorized: Last rx 04/20/23 #84 with 3 refills. Please approve or deny

## 2024-03-30 ENCOUNTER — Ambulatory Visit: Payer: 59 | Admitting: Nurse Practitioner

## 2024-04-21 ENCOUNTER — Encounter: Payer: Self-pay | Admitting: Family Medicine

## 2024-04-21 ENCOUNTER — Ambulatory Visit (INDEPENDENT_AMBULATORY_CARE_PROVIDER_SITE_OTHER): Admitting: Family Medicine

## 2024-04-21 VITALS — BP 126/80 | HR 81 | Temp 98.7°F | Ht 64.25 in | Wt 189.4 lb

## 2024-04-21 DIAGNOSIS — Z0000 Encounter for general adult medical examination without abnormal findings: Secondary | ICD-10-CM

## 2024-04-21 DIAGNOSIS — R5383 Other fatigue: Secondary | ICD-10-CM | POA: Diagnosis not present

## 2024-04-21 DIAGNOSIS — D563 Thalassemia minor: Secondary | ICD-10-CM | POA: Diagnosis not present

## 2024-04-21 DIAGNOSIS — Z0001 Encounter for general adult medical examination with abnormal findings: Secondary | ICD-10-CM | POA: Diagnosis not present

## 2024-04-21 DIAGNOSIS — R718 Other abnormality of red blood cells: Secondary | ICD-10-CM

## 2024-04-21 NOTE — Progress Notes (Signed)
 Subjective:    Patient ID: Christy Park, female    DOB: [birth year], 23 y.o.   MRN: [id number]  HPI Patient is a very sweet 23 year old African-American female who presents today to establish care.  She is the daughter of Christy Park and Christy Park who are also my patients.  She denies any concerns.  However last year she was dealing with fatigue.  Lab work at that time revealed microcytosis.  She also had an apparent thrombocytopenia.  She was referred to hematology.  The thrombocytopenia was found to be false.  Instead this was due to platelet aggregation and clumping on the smear.  However she had persistent microcytosis and was found to have alpha thalassemia trait.  She was also found to have iron deficiency and B12 deficiency.    Saw the patient initially in March and at that time B12 levels were normal and iron levels are normal.  Therefore we stopped iron.  Patient states that since that time, she feels tired.  She has more fatigue.  She does have monthly menstrual cycles however the bleeding is only heavy for 1 to 2 days due to her birth control.  She is concerned that her iron levels may be dropping again.  She continues to take B12.  She denies any chest pain or shortness of breath.  Her Pap smear per gynecologist.  She is due for a tetanus shot but she declines this today.  Otherwise she is doing well with no concerns. Past Medical History:  Diagnosis Date   Thalassemia alpha carrier    No past surgical history on file. Current Outpatient Medications on File Prior to Visit  Medication Sig Dispense Refill   cyanocobalamin  (VITAMIN B12) 1000 MCG tablet Take 1 tablet (1,000 mcg total) by mouth daily. 30 tablet 3   ferrous sulfate 324 MG TBEC Take 324 mg by mouth daily with breakfast.     norethindrone-ethinyl estradiol-FE (LOESTRIN  FE) 1-20 MG-MCG tablet Take 1 tablet by mouth daily. 84 tablet 3   No current facility-administered medications on file prior to visit.   No Known  Allergies Social History   Socioeconomic History   Marital status: Single    Spouse name: Not on file   Number of children: Not on file   Years of education: Not on file   Highest education level: Bachelor's degree (e.g., BA, AB, BS)  Occupational History   Not on file  Tobacco Use   Smoking status: Never    Passive exposure: Never   Smokeless tobacco: Never  Substance and Sexual Activity   Alcohol use: Never   Drug use: Never   Sexual activity: Yes    Partners: Male    Birth control/protection: Pill    Comment: menarche 23yo, sexual debut 23yo  Other Topics Concern   Not on file  Social History Narrative   Not on file   Social Drivers of Health   Financial Resource Strain: Low Risk  (03/03/2024)   Overall Financial Resource Strain (CARDIA)    Difficulty of Paying Living Expenses: Not hard at all  Food Insecurity: No Food Insecurity (03/03/2024)   Hunger Vital Sign    Worried About Running Out of Food in the Last Year: Never true    Ran Out of Food in the Last Year: Never true  Transportation Needs: No Transportation Needs (03/03/2024)   PRAPARE - Administrator, Civil Service (Medical): No    Lack of Transportation (Non-Medical): No  Physical Activity: Insufficiently Active (03/03/2024)  Exercise Vital Sign    Days of Exercise per Week: 2 days    Minutes of Exercise per Session: 60 min  Stress: No Stress Concern Present (03/03/2024)   [employer] of Occupational Health - Occupational Stress Questionnaire    Feeling of Stress : Only a little  Social Connections: Moderately Isolated (03/03/2024)   Social Connection and Isolation Panel [NHANES]    Frequency of Communication with Friends and Family: More than three times a week    Frequency of Social Gatherings with Friends and Family: Patient declined    Attends Religious Services: More than 4 times per year    Active Member of [employer] or Organizations: No    Attends Engineer, structural: Not on  file    Marital Status: Never married  Intimate Partner Violence: Not on file  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 Family History  Problem Relation Age of Onset   Diabetes Father    Prostate cancer Father    Other Maternal Grandmother        tumor      Review of Systems  All other systems reviewed and are negative.      Objective:   Physical Exam Vitals reviewed.  Constitutional:      General: She is not in acute distress.    Appearance: Normal appearance. She is normal weight. She is not ill-appearing, toxic-appearing or diaphoretic.  HENT:     Right Ear: Tympanic membrane and ear canal normal.     Left Ear: Tympanic membrane and ear canal normal.     Nose: Nose normal. No congestion or rhinorrhea.     Mouth/Throat:     Mouth: Mucous membranes are moist.     Pharynx: Oropharynx is clear. No oropharyngeal exudate or posterior oropharyngeal erythema.  Eyes:     Extraocular Movements: Extraocular movements intact.     Conjunctiva/sclera: Conjunctivae normal.     Pupils: Pupils are equal, round, and reactive to light.  Neck:     Vascular: No carotid bruit.  Cardiovascular:     Rate and Rhythm: Regular rhythm. Tachycardia present.     Pulses: Normal pulses.     Heart sounds: Normal heart sounds.  Pulmonary:     Effort: Pulmonary effort is normal. No respiratory distress.     Breath sounds: Normal breath sounds. No wheezing, rhonchi or rales.  Abdominal:     General: Abdomen is flat. Bowel sounds are normal. There is no distension.     Palpations: Abdomen is soft.     Tenderness: There is no abdominal tenderness. There is no guarding or rebound.  Musculoskeletal:     Cervical back: Normal range of motion. No rigidity or tenderness.     Right lower leg: No edema.     Left lower leg: No edema.  Lymphadenopathy:     Cervical: No cervical adenopathy.  Neurological:     General: No focal deficit present.     Mental Status: She  is alert and oriented to person, place, and time.  Psychiatric:        Mood and Affect: Mood normal.        Behavior: Behavior normal.        Thought Content: Thought content normal.        Judgment: Judgment normal.           Assessment & Plan:  Fatigue, unspecified type - Plan: CBC with Differential/Platelet, COMPLETE METABOLIC PANEL WITHOUT GFR, Iron,  TSH  Microcytosis  Thalassemia alpha carrier  General medical exam Given fatigue will check CBC and iron level a TSH and a CMP.  If iron levels are dropping, the patient may need to resume an iron supplement.  Her menstrual cycles do not sound heavy enough to justify a sudden drop in iron after only 1 month.  Monitor for hypothyroidism given the family history of such.  Patient politely declines a tetanus shot.  In the future I would like to check a fasting lipid panel however the patient has eaten today.  Defer her Pap smear to her gynecologist.  Not yet due for breast cancer screening or colon cancer screening

## 2024-04-22 LAB — CBC WITH DIFFERENTIAL/PLATELET
Absolute Lymphocytes: 2439 {cells}/uL (ref 850–3900)
Absolute Monocytes: 579 {cells}/uL (ref 200–950)
Basophils Absolute: 89 {cells}/uL (ref 0–200)
Basophils Relative: 1 %
Eosinophils Absolute: 258 {cells}/uL (ref 15–500)
Eosinophils Relative: 2.9 %
HCT: 39.5 % (ref 35.0–45.0)
Hemoglobin: 12.4 g/dL (ref 11.7–15.5)
MCH: 22.7 pg — ABNORMAL LOW (ref 27.0–33.0)
MCHC: 31.4 g/dL — ABNORMAL LOW (ref 32.0–36.0)
MCV: 72.2 fL — ABNORMAL LOW (ref 80.0–100.0)
MPV: 12.2 fL (ref 7.5–12.5)
Monocytes Relative: 6.5 %
Neutro Abs: 5536 {cells}/uL (ref 1500–7800)
Neutrophils Relative %: 62.2 %
Platelets: 78 10*3/uL — ABNORMAL LOW (ref 140–400)
RBC: 5.47 10*6/uL — ABNORMAL HIGH (ref 3.80–5.10)
RDW: 12.9 % (ref 11.0–15.0)
Total Lymphocyte: 27.4 %
WBC: 8.9 10*3/uL (ref 3.8–10.8)

## 2024-04-22 LAB — COMPLETE METABOLIC PANEL WITHOUT GFR
AG Ratio: 1.4 (calc) (ref 1.0–2.5)
ALT: 10 U/L (ref 6–29)
AST: 15 U/L (ref 10–30)
Albumin: 4.3 g/dL (ref 3.6–5.1)
Alkaline phosphatase (APISO): 66 U/L (ref 31–125)
BUN: 7 mg/dL (ref 7–25)
CO2: 25 mmol/L (ref 20–32)
Calcium: 9.8 mg/dL (ref 8.6–10.2)
Chloride: 104 mmol/L (ref 98–110)
Creat: 0.74 mg/dL (ref 0.50–0.96)
Globulin: 3.1 g/dL (ref 1.9–3.7)
Glucose, Bld: 81 mg/dL (ref 65–99)
Potassium: 5.3 mmol/L (ref 3.5–5.3)
Sodium: 138 mmol/L (ref 135–146)
Total Bilirubin: 0.5 mg/dL (ref 0.2–1.2)
Total Protein: 7.4 g/dL (ref 6.1–8.1)

## 2024-04-22 LAB — TSH: TSH: 1.96 m[IU]/L

## 2024-04-22 LAB — IRON: Iron: 130 ug/dL (ref 40–190)

## 2024-05-08 ENCOUNTER — Ambulatory Visit: Admitting: Nurse Practitioner

## 2024-05-08 ENCOUNTER — Encounter: Payer: Self-pay | Admitting: Nurse Practitioner

## 2024-05-08 ENCOUNTER — Other Ambulatory Visit (HOSPITAL_COMMUNITY)
Admission: RE | Admit: 2024-05-08 | Discharge: 2024-05-08 | Disposition: A | Discharge status: Home / Self Care | Source: Ambulatory Visit | Attending: Nurse Practitioner | Admitting: Nurse Practitioner

## 2024-05-08 VITALS — BP 102/68 | HR 96 | Ht 64.5 in | Wt 186.0 lb

## 2024-05-08 DIAGNOSIS — Z124 Encounter for screening for malignant neoplasm of cervix: Secondary | ICD-10-CM | POA: Insufficient documentation

## 2024-05-08 DIAGNOSIS — Z113 Encounter for screening for infections with a predominantly sexual mode of transmission: Secondary | ICD-10-CM | POA: Diagnosis present

## 2024-05-08 DIAGNOSIS — Z3041 Encounter for surveillance of contraceptive pills: Secondary | ICD-10-CM

## 2024-05-08 DIAGNOSIS — Z1331 Encounter for screening for depression: Secondary | ICD-10-CM

## 2024-05-08 DIAGNOSIS — Z01419 Encounter for gynecological examination (general) (routine) without abnormal findings: Secondary | ICD-10-CM | POA: Diagnosis not present

## 2024-05-08 MED ORDER — NORETHIN ACE-ETH ESTRAD-FE 1-20 MG-MCG PO TABS: 1.0000 | ORAL_TABLET | Freq: Every day | ORAL | 3 refills | Status: AC

## 2024-05-08 NOTE — Progress Notes (Signed)
 Christy Park [birth year] [id number]   History:  23 y.o. G0 presents for annual exam. Monthly cycles. COCs. Normal pap history. Gardasil series completed. Multiple family family members on mother's side with thyroid  disease. Would like STD screening today.   Gynecologic History Patient's last menstrual period was 04/25/2024 (exact date). Period Cycle (Days):  (28-30) Period Duration (Days): 3-4 Menstrual Flow:  (Heavy for first couple days, then tapers) Menstrual Control: Maxi pad Dysmenorrhea: (!) Moderate Dysmenorrhea Symptoms: Cramping, Nausea (nausea intermittent.) Contraception/Family planning: OCP (estrogen/progesterone) Sexually active: Yes  Health Maintenance Last Pap: 01/24/2021. Results were: Normal Last mammogram: Not indicated Last colonoscopy: Not indicated Last Dexa: Not indicated      04/21/2024   10:12 AM  Depression screen PHQ 2/9  Decreased Interest 0  Down, Depressed, Hopeless 0  PHQ - 2 Score 0     Past medical history, past surgical history, family history and social history were all reviewed and documented in the EPIC chart. 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 Boyfriend of 4 years.   ROS:  A ROS was performed and pertinent positives and negatives are included.  Exam:  Vitals:   05/08/24 1129  BP: 102/68  Pulse: 96  SpO2: 98%  Weight: 186 lb (84.4 kg)  Height: 5' 4.5" (1.638 m)    Body mass index is 31.43 kg/m.  General appearance:  Normal Thyroid :  Symmetrical, normal in size, without palpable masses or nodularity. Respiratory  Auscultation:  Clear without wheezing or rhonchi Cardiovascular  Auscultation:  Regular rate, without rubs, murmurs or gallops  Edema/varicosities:  Not grossly evident Abdominal  Soft,nontender, without masses, guarding or rebound.  Liver/spleen:  No organomegaly noted  Hernia:  None appreciated  Skin  Inspection:  Grossly normal Breasts: Not indicated per guidelines Pelvic: External genitalia:  no  lesions              Urethra:  normal appearing urethra with no masses, tenderness or lesions              Bartholins and Skenes: normal                 Vagina: normal appearing vagina with normal color and discharge, no lesions              Cervix: no lesions Bimanual Exam:  Uterus:  no masses or tenderness              Adnexa: no mass, fullness, tenderness              Rectovaginal: Deferred              Anus:  normal, no lesions  Patient informed chaperone available to be present for breast and pelvic exam. Patient has requested no chaperone to be present. Patient has been advised what will be completed during breast and pelvic exam.   Assessment/Plan:  23 y.o. G0 for annual exam.   Well female exam with routine gynecological exam - Education provided on SBEs, importance of preventative screenings, current guidelines, high calcium diet, regular exercise, and multivitamin daily. Labs with PCP.   Encounter for surveillance of contraceptive pills - Plan: norethindrone-ethinyl estradiol-FE (LOESTRIN  FE) 1-20 MG-MCG tablet daily. Taking as prescribed. Refill x 1 year provided.   Cervical cancer screening - Plan: Cytology - PAP( [address]). Normal Pap history.    Screen for STD (sexually transmitted disease) - Plan: Cytology - PAP( [place]). GC/CT/trich added to pap.   Return in about 1 year (around 05/08/2025) for  Annual.    Christy Bamberger DNP, 12:14 PM 05/08/2024

## 2024-05-10 ENCOUNTER — Ambulatory Visit: Payer: Self-pay | Admitting: Nurse Practitioner

## 2024-05-10 LAB — CYTOLOGY - PAP
Adequacy: ABSENT
Chlamydia: NEGATIVE
Comment: NEGATIVE
Comment: NEGATIVE
Comment: NORMAL
Diagnosis: NEGATIVE
Neisseria Gonorrhea: NEGATIVE
Trichomonas: NEGATIVE
